# Patient Record
Sex: Female | Born: 1965 | Race: Black or African American | Hispanic: No | Marital: Married | Smoking: Former smoker
Health system: Southern US, Community
[De-identification: ages and names within clinical notes are randomized; demographics above are authoritative.]

## PROBLEM LIST (undated history)

## (undated) DIAGNOSIS — N920 Excessive and frequent menstruation with regular cycle: Secondary | ICD-10-CM

## (undated) HISTORY — DX: Excessive and frequent menstruation with regular cycle: N92.0

---

## 1998-04-18 ENCOUNTER — Other Ambulatory Visit: Admission: RE | Admit: 1998-04-18 | Discharge: 1998-04-18 | Payer: Self-pay | Admitting: Internal Medicine

## 2000-04-02 ENCOUNTER — Ambulatory Visit (HOSPITAL_COMMUNITY): Admission: RE | Admit: 2000-04-02 | Discharge: 2000-04-02 | Payer: Self-pay | Admitting: Obstetrics and Gynecology

## 2000-04-02 ENCOUNTER — Encounter: Payer: Self-pay | Admitting: Obstetrics and Gynecology

## 2000-04-17 ENCOUNTER — Other Ambulatory Visit: Admission: RE | Admit: 2000-04-17 | Discharge: 2000-04-17 | Payer: Self-pay | Admitting: Obstetrics and Gynecology

## 2000-06-07 ENCOUNTER — Ambulatory Visit (HOSPITAL_COMMUNITY): Admission: RE | Admit: 2000-06-07 | Discharge: 2000-06-07 | Payer: Self-pay | Admitting: Obstetrics and Gynecology

## 2000-06-07 ENCOUNTER — Encounter: Payer: Self-pay | Admitting: Obstetrics and Gynecology

## 2000-07-01 ENCOUNTER — Inpatient Hospital Stay (HOSPITAL_COMMUNITY): Admission: AD | Admit: 2000-07-01 | Discharge: 2000-07-01 | Payer: Self-pay | Admitting: Obstetrics and Gynecology

## 2000-11-11 ENCOUNTER — Inpatient Hospital Stay (HOSPITAL_COMMUNITY): Admission: AD | Admit: 2000-11-11 | Discharge: 2000-11-14 | Payer: Self-pay | Admitting: Obstetrics and Gynecology

## 2000-11-15 ENCOUNTER — Encounter: Admission: RE | Admit: 2000-11-15 | Discharge: 2000-12-15 | Payer: Self-pay | Admitting: Obstetrics and Gynecology

## 2001-02-21 ENCOUNTER — Other Ambulatory Visit: Admission: RE | Admit: 2001-02-21 | Discharge: 2001-02-21 | Payer: Self-pay | Admitting: Obstetrics and Gynecology

## 2001-05-22 ENCOUNTER — Ambulatory Visit (HOSPITAL_COMMUNITY): Admission: RE | Admit: 2001-05-22 | Discharge: 2001-05-22 | Payer: Self-pay | Admitting: Obstetrics and Gynecology

## 2001-05-22 ENCOUNTER — Encounter: Payer: Self-pay | Admitting: Obstetrics and Gynecology

## 2001-07-09 ENCOUNTER — Ambulatory Visit (HOSPITAL_COMMUNITY): Admission: RE | Admit: 2001-07-09 | Discharge: 2001-07-09 | Payer: Self-pay | Admitting: Obstetrics and Gynecology

## 2001-07-09 ENCOUNTER — Encounter: Payer: Self-pay | Admitting: Obstetrics and Gynecology

## 2001-08-07 ENCOUNTER — Encounter: Payer: Self-pay | Admitting: Obstetrics and Gynecology

## 2001-08-07 ENCOUNTER — Ambulatory Visit (HOSPITAL_COMMUNITY): Admission: RE | Admit: 2001-08-07 | Discharge: 2001-08-07 | Payer: Self-pay | Admitting: Obstetrics and Gynecology

## 2001-12-11 ENCOUNTER — Inpatient Hospital Stay (HOSPITAL_COMMUNITY): Admission: AD | Admit: 2001-12-11 | Discharge: 2001-12-14 | Payer: Self-pay | Admitting: Obstetrics and Gynecology

## 2002-01-21 ENCOUNTER — Other Ambulatory Visit: Admission: RE | Admit: 2002-01-21 | Discharge: 2002-01-21 | Payer: Self-pay | Admitting: Obstetrics and Gynecology

## 2003-07-05 ENCOUNTER — Other Ambulatory Visit: Admission: RE | Admit: 2003-07-05 | Discharge: 2003-07-05 | Payer: Self-pay | Admitting: Obstetrics and Gynecology

## 2004-10-16 ENCOUNTER — Other Ambulatory Visit: Admission: RE | Admit: 2004-10-16 | Discharge: 2004-10-16 | Payer: Self-pay | Admitting: Obstetrics and Gynecology

## 2005-10-30 ENCOUNTER — Other Ambulatory Visit: Admission: RE | Admit: 2005-10-30 | Discharge: 2005-10-30 | Payer: Self-pay | Admitting: Obstetrics and Gynecology

## 2009-04-12 ENCOUNTER — Ambulatory Visit (HOSPITAL_COMMUNITY): Admission: RE | Admit: 2009-04-12 | Discharge: 2009-04-12 | Payer: Self-pay | Admitting: Obstetrics and Gynecology

## 2010-08-04 NOTE — H&P (Signed)
NAME:  Kristine Johns, SIKKEMA NO.:  [id number]   MEDICAL RECORD NO.:  [id number]                   PATIENT TYPE:  INP   LOCATION:  9168                                 FACILITY:  [facility]   PHYSICIAN:  Hal Morales, M.D.             DATE OF BIRTH:  [birth year]   DATE OF ADMISSION:  12/11/2001  DATE OF DISCHARGE:                                HISTORY & PHYSICAL   HISTORY OF PRESENT ILLNESS:  The patient is a 45 year old gravida 3 para 2-0-  0-2 at 40 and six-sevenths weeks who presented from the office for a repeat  NST and PIH labs secondary to a borderline AFI of 7.2 and an elevated blood  pressure in the office.  She denied any headache, visual symptoms, or  epigastric pain.  In maternity admissions she was noted to have normal PIH  labs.  Blood pressures ranged in the 130s to 140s over 80s to 96 diastolic  range.  Her fetal heart rate tracing did show some sporadic intermittent  variables with no late decelerations and irregular mild contractions.  The  decision was made then to admit the patient for induction per Dr. Dierdre Forth.  Pregnancy has been remarkable for:  1. History of postpartum hypertension.  2. Daughter with occult spina bifida.  3. High risk HPV.  4. Closely-spaced pregnancies.   PRENATAL LABORATORY DATA:  Blood type AB positive, Rh antibody negative.  VDRL nonreactive.  Rubella titer positive.  Hepatitis B surface antigen  negative.  Colposcopy showed HPV changes and high-risk HPV.  Urinalysis in  April showed E. coli.  Glucose challenge was normal.  AFP was normal.  Sickle cell trait was negative.  Hemoglobin upon entering the practice was  12.4; it was 12.4 at 27 and six-sevenths weeks.  EDC of December 05, 2001  was established by 11 week ultrasound and was in agreement with ultrasound  at approximately 18 weeks.   HISTORY OF PRESENT PREGNANCY:  The patient entered care at approximately 15  weeks.  She had a urine culture at  her first visit that showed E. coli; this  was treated with Macrobid.  She had another ultrasound at 18 weeks.  She had  normal growth and fluid.  She was treated for a yeast infection at that  time.  She still had a positive urine culture at 18 weeks; this was treated  with ampicillin.  Test of cure was repeated that was sensitive to Septra.  She had a Glucola that was normal.  She was considering an IUD postpartum.  She had an ultrasound at 35 weeks that showed vertex presentation.  She had  some bright red bleeding at 36 weeks.  She was seen at that time.  She still  had a positive  Chem-9 and was treated with Macrobid.  The rest of her pregnancy was  essentially uncomplicated.  She had a visit today  and had elevated blood  pressures.  She had an NST due to being almost 41 weeks that was  questionably reactive.  She had a biophysical profile that was within normal  limits and an AFI of 7.2.   OBSTETRICAL HISTORY:  In 1988 she had the vaginal birth of a female infant  that weighed 6 pounds 14 ounces at [redacted] weeks gestation.  She was in labor 12  hours.  She had unsure type of medication.  In August 2002 she had a vaginal  birth of a female infant, weight 7 pounds 13 ounces at 40 and six-sevenths  weeks.  She was in labor 10 hours.  She had epidural anesthesia.   MEDICAL HISTORY:  She is a previous diaphragm and condom user.  She has a  history of high-risk HPV changes on Pap.  She was treated for [facility] and  chlamydia in 2001.  She has occasional yeast infections.  She has a history  of acid reflux disease.  She broke her right arm in the second grade.  Her  only other hospitalization was for childbirth.   ALLERGIES:  The patient has no known medication allergies.   FAMILY HISTORY:  Her first cousin had a stroke at age 6.  Her mother is  hypertensive, on medication.  Maternal uncle had lung cancer.   GENETIC HISTORY:  Remarkable for the patient's daughter having questionable  occult spina  bifida.  There were no deficits and this was seen on x-ray.  There were no pilonidal cysts noted or skin openings.   SOCIAL HISTORY:  The patient is married to the father of the baby.  He is  involved and supportive.  His name is Chia Mowers.  The patient is graduate  educated; she is a Runner, broadcasting/film/video.  Her husband has college education; he is city  Financial controller.  She has been followed by the physician service of [facility].  She denies any alcohol, drug, or tobacco use during this pregnancy.  She is a previous smoker but stopped approximately one year ago.   PHYSICAL EXAMINATION:  VITAL SIGNS:  Blood pressure 130/80.  Other vital  signs are stable.  HEENT:  Within normal limits.  LUNGS:  Bilateral breath sounds are clear.  HEART:  Regular rate and rhythm without murmur.  BREASTS:  Soft and nontender.  ABDOMEN:  Fundal height is approximately 39 cm.  Estimated fetal weight is  7.5 to 8.5 pounds.  Uterine contractions are very occasional and mild.  PELVIC:  Cervical exam by Dr. Pennie Rushing was 2-3 cm, 70%, vertex at a -2  station.  Fetal heart rate shows intermittent variables but no late  decelerations.  There was more variability as the tracing progressed.  LABORATORY DATA:  PIH labs were within normal limits.  Urine was negative at  the office for protein.  EXTREMITIES:  Deep tendon reflexes are 2+ without clonus.  There is a trace  edema noted.   IMPRESSION:  1. Intrauterine pregnancy at 40 and six-sevenths weeks.  2. Favorable cervix.  3. Equivocal fetal heart rate tracing.  4. Post dates.   PLAN:  1. Admit to birthing suite per consult with Dr. Dierdre Forth as attending     physician.  2. Routine physician orders.  3. Dr. Pennie Rushing plans Pitocin per low-dose protocol.     Renaldo Reel Emilee Hero, C.N.M.                   Hal Morales, M.D.  VLL/MEDQ  D:  12/11/2001  T:  12/11/2001  Job:  [id number]

## 2010-08-04 NOTE — H&P (Signed)
[facility]  Patient:    Kristine Johns, Kristine Johns Visit Number: [id number] MRN: [id number]          Service Type: OBS Location: 910A 9109 01 Attending Physician:  Leonard Schwartz Dictated by:   Nigel Bridgeman, C.N.M. Adm. Date:  11/11/2000                           History and Physical  HISTORY OF PRESENT ILLNESS:   The patient is a 45 year old gravida 2, para 1-0-0-1, at 40-6/7 weeks, who presents for induction secondary to post dates, a nonreactive NST today in the office, and a biophysical profile of 4 out of 8. Cervix is 1 cm, 50% vertex, and a -2 in the office today.  Pregnancy has been remarkable for: 1. Positive group B strep. 2. First trimester spotting. 3. Smoker. 4. History of STDs. 5. Daughter with questionable spina bifida occulta. 6. Ascus on Pap and new OB, with a plan to repeat postpartum.  PRENATAL LABORATORY DATA:     Blood type is AB-positive.  Rh antibody negative.  VDRL nonreactive.  Rubella titer positive.  Hepatitis B surface antigen negative.  HIV nonreactive.  Sickle cell test is negative.  Pap on new OB showed ascus.  GC and Chlamydia cultures were negative.  AFP was normal. One-hour glucola was normal.  Hemoglobin upon entry into practice was 12.1. It was 10.7 at 27 weeks.  The patient declined repeat Pap smear during her pregnancy and wished this to be done postpartum.  EDC of November 05, 2000, was established by last menstrual period and was in agreement with ultrasound at approximately eight weeks and then again at 19 weeks.  Group B strep culture was positive at 36 weeks.  HISTORY OF PRESENT PREGNANCY: The patient entered care at approximately 11 weeks.  She had had some first trimester bleeding for which she had an ultrasound.  She had an ultrasound at [facility] at 18 weeks with normal findings.  She had some cramping at 20 weeks.  Ascus on Pap was reviewed at 24 weeks.  The patient was upset that she was not  informed.  She declined a repeat until postpartum.  She had a UTI noted and was treated with Macrobid. She was treated again with Macrobid at 33 weeks.  Beta strep was positive. She had an ultrasound at 40 weeks to verify position.  She had an NST today that was nonreactive and a BPP which was 4 out of 8 with no fetal breathing movement and poor tone.  OBSTETRICAL HISTORY:          In 1998, she had a vaginal birth of a female infant, weight 6 pounds and 14 ounces at [redacted] weeks gestation.  She was in labor 12 hours.  She had no problems.  PAST SURGICAL HISTORY:        She is a diaphragm and condom user.  She had an abnormal Pap in April 2000.  Her repeat q.6 months were normal.  In August 2001, she was treated for gonorrhea and Chlamydia.  Her partner was also treated.  She has occasional yeast infection three to four times per year. She reports usual childhood illnesses.  She had a history of acid reflux during stress at times.  She has a history of a social smoker.  She broke her right arm in 7th grade.  Her only other hospitalizations were for childbirth.  ALLERGIES:  No known drug allergies.  FAMILY HISTORY:               Her mother is hypertensive on medication.  Her maternal uncle had lung cancer and was a smoker.  Her first cousin on her mothers side had stroke in early 92s.  Father of the baby is a smoker.  GENETIC HISTORY:              Remarkable for recently finding out that her daughters spine was not fully closed that may be a spina bifida occulta.  SOCIAL HISTORY:               The patient is married.  The father of the baby is involved and supportive.  His name is Yarixa Lightcap.  The patient is college educated.  She is a Runner, broadcasting/film/video.  Her husband has a high school education and he is employed as a Careers adviser.  She is African-American of the [employer]. She has been followed by certified nurse midwife service at [facility].  She denies any alcohol  or drug use during this pregnancy.  She has had some social tobacco use.  PHYSICAL EXAMINATION:  VITAL SIGNS:                  Stable and the patient is afebrile.  HEENT:                        Within normal limits.  LUNGS:                        Bilateral breath sounds are clear.  HEART:                        Regular rate and rhythm without murmur.  BREASTS:                      Soft and nontender.  ABDOMEN:                      Fundal height is approximately 39 cm.  Estimated fetal weight is 7-1/2 to 8 pounds.  Uterine contractions are very occasional and mild.  CERVICAL EXAMINATION:         Today in the office was 1 cm, 50% vertex, and a -2 station.  Fetal heart rate is in process of being evaluated.  The patient is now being placed on the monitor.  EXTREMITIES:                  Deep tendon reflexes are 2+ without clonus. There is a trace edema noted.  IMPRESSION:                   1. Intrauterine pregnancy at 40-6/7 weeks.                               2. For induction secondary to post dates,                                  nonreactive nonstress test, and biophysical  profile of 4 out of 8.                               3. Positive group B streptococcus.  PLAN:                         1. Admit to birthing suite for consult with                                  Dr. Marline Backbone, the attending physician.                               2. Routine certified nurse midwife orders.                               3. Plan group B strep prophylaxis with                                  penicillin G for standard dosing once labor                                  ensues.                               4. Plan Cytotec this evening and Pitocin                                  initiation in the morning. Dictated by:   Nigel Bridgeman, C.N.M. Attending Physician:  Leonard Schwartz DD:    /  / TD:  11/11/00 Job: [id number]

## 2010-08-04 NOTE — H&P (Signed)
[facility] of [facility]  Patient:    Kristine Johns, Kristine Johns Visit Number: [id number] MRN: [id number]          Service Type: OBS Location: 910B 9163 01 Attending Physician:  Leonard Schwartz Dictated by:   Nigel Bridgeman, C.N.M. Adm. Date:  11/11/2000                           History and Physical  HISTORY OF PRESENT ILLNESS:   The patient is a 45 year old gravida 2, para 1-0-0-1, at 40-6/7 weeks who presents for induction secondary to post dates, a nonreactive NST today in the office, and a biophysical profile of 4 out of 8. Cervix is 1 cm, 50% vertex, and a -2 in the office today.  Pregnancy has been remarkable for:  1. Positive group B strep. 2. First trimester spotting. 3. Smoker. 4. History of STDs. 5. History with questionable spina bifida culture. 6. ASCUS on Pap, _______ with a plan to repeat postpartum.  PRENATAL LABORATORY DATA:     Blood type is AB-positive.  Rh antibody negative.  VDRL nonreactive.  Rubella titer positive.  Hepatitis B surface antigen negative.  HIV nonreactive.  Sickle cell test is negative.  Pap on _______ showed ASCUS.  GC and Chlamydia cultures were negative.  AFP was normal.  One-hour glucola was normal.  Hemoglobin upon entry into practice was 12.1.  It was 10.7 at 27 weeks.  The patient declined repeat Pap smear during her pregnancy and wished this to be done postpartum.  EDC of November 05, 2000, was established by last menstrual period and was in agreement with ultrasound at approximately eight weeks and then again at 19 weeks.  Group B strep culture was positive at 36 weeks.  HISTORY OF PRESENT PREGNANCY: The patient entered care at approximately 11 weeks.  She had had some first trimester bleeding for which she had an ultrasound.  She had an ultrasound at [facility] at 18 weeks with normal findings.  She had some cramping at 20 weeks.  ASCUS on Pap was reviewed at 24 weeks.  The patient was upset that she was not  informed.  She declined a repeat until postpartum.  She had a UTI noted and was treated with Macrobid. She was treated again with Macrobid at 33 weeks.  Beta strep was positive. She had an ultrasound at 40 weeks to verify position.  She had an NST today that was nonreactive and a ____ which was 4 out of 8 with no fetal breathing movement and poor tone.  OBSTETRICAL HISTORY:          In 1998, she had a vaginal birth of a female infant, weight 6 pounds and 14 ounces at [redacted] weeks gestation.  She was in labor 12 hours.  She had no problems.  PAST SURGICAL HISTORY:        She is a diaphragm and condom user.  She had an abnormal Pap in April 2000.  Her repeat q.6 months were normal.  In August 2001, she was treated for gonorrhea and Chlamydia.  Her partner was also treated.  She has occasional yeast infection three to four times per year. She reports usual childhood illnesses.  She had a history of acid reflux during stress at times.  She has a history of a social smoker.  She broke her right arm in 7th grade.  Her only other hospitalizations were for childbirth.  ALLERGIES:  No known drug allergies.  FAMILY HISTORY:               Her mother is hypertensive on medication.  Her maternal uncle had lung cancer and was a smoker.  Her first cousin on her mothers side had stroke in early 34s.  Father of the baby is a smoker.  GENETIC HISTORY:              Remarkable for recently finding out that her daughters spine was not fully closed that may be a spina bifida occulta.  SOCIAL HISTORY:               The patient is married.  The father of the baby is involved and supportive.  His name is Serinity Ware.  The patient is college educated.  She is a Runner, broadcasting/film/video.  Her husband has a high school education and he is employed as a Careers adviser.  She is African-American of the [employer]. She has been followed by certified nurse midwife service at [facility].  She denies any  alcohol or drug use during this pregnancy.  She has had some social tobacco use.  PHYSICAL EXAMINATION:  VITAL SIGNS:                  Stable and the patient is afebrile.  HEENT:                        Within normal limits.  LUNGS:                        Bilateral breath sounds are clear.  HEART:                        Regular rate and rhythm without murmur.  BREASTS:                      Soft and nontender.  ABDOMEN:                      Fundal height is approximately 39 cm.  Estimated fetal weight is 7-1/2 to 8 pounds.  Uterine contractions are very occasional and mild.  CERVICAL EXAMINATION:         Today in the office was 1 cm, 50% vertex, and a -2 station.  Fetal heart rate is in process of being evaluated.  The patient is now being placed on the monitor.  EXTREMITIES:                  Deep tendon reflexes are 2+ without clonus. There is a trace edema noted.  IMPRESSION:                   1. Intrauterine pregnancy at 40-6/7 weeks.                               2. For induction secondary to post dates,                                  nonreactive nonstress test, and ____ of  4 out of 8.                               3. Positive group B streptococcus.  PLAN:                         1. Admit to birthing suite for consult with                                  Dr. Marline Backbone, the attending physician.                               2. Routine certified nurse midwife orders.                               3. Plan group B strep prophylaxis with                                  penicillin G for standard dosing once labor                                  ensues.                               4. Plan Cytotec this evening and Pitocin                                  initiation in the morning. Dictated by:   Nigel Bridgeman, C.N.M. Attending Physician:  Leonard Schwartz DD:  11/11/00 TD:  11/11/00 Job: [id number]

## 2010-09-26 ENCOUNTER — Other Ambulatory Visit: Payer: Self-pay | Admitting: Family Medicine

## 2010-09-26 DIAGNOSIS — Z1231 Encounter for screening mammogram for malignant neoplasm of breast: Secondary | ICD-10-CM

## 2011-02-15 ENCOUNTER — Other Ambulatory Visit (HOSPITAL_COMMUNITY): Payer: Self-pay | Admitting: Family Medicine

## 2011-02-15 DIAGNOSIS — Z1231 Encounter for screening mammogram for malignant neoplasm of breast: Secondary | ICD-10-CM

## 2011-03-23 ENCOUNTER — Ambulatory Visit (HOSPITAL_COMMUNITY)
Admission: RE | Admit: 2011-03-23 | Discharge: 2011-03-23 | Disposition: A | Discharge status: Home / Self Care | Payer: BC Managed Care – PPO | Source: Ambulatory Visit | Attending: Family Medicine | Admitting: Family Medicine

## 2011-03-23 DIAGNOSIS — Z1231 Encounter for screening mammogram for malignant neoplasm of breast: Secondary | ICD-10-CM | POA: Insufficient documentation

## 2011-03-23 IMAGING — MG MM DIGITAL SCREENING BILAT
5 series · 5 of 5 positions shown · non-contrast
Comparison: Prior studies.

DG SCREEN MAMMOGRAM BILATERAL
Bilateral CC and MLO view(s) were taken.
Prior study comparison: [DATE], DG screen mammogram bilateral.

DIGITAL SCREENING MAMMOGRAM WITH CAD:

[R CC (1 of 2)]
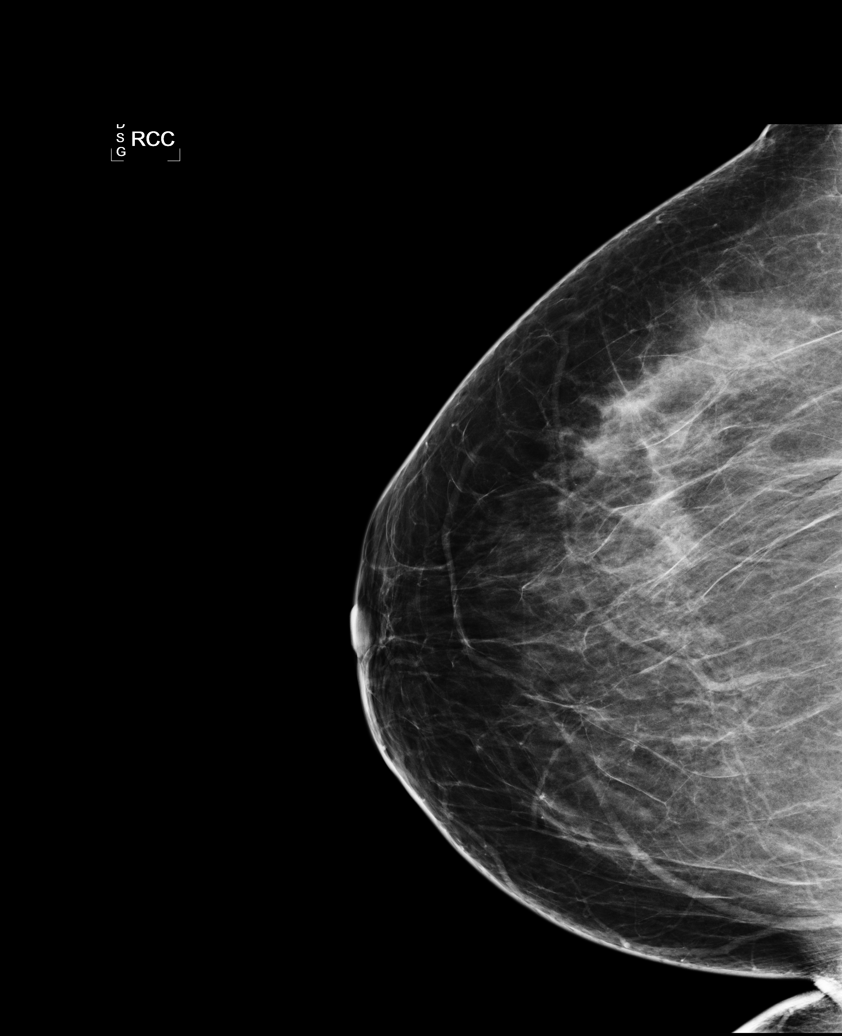

[R MLO]
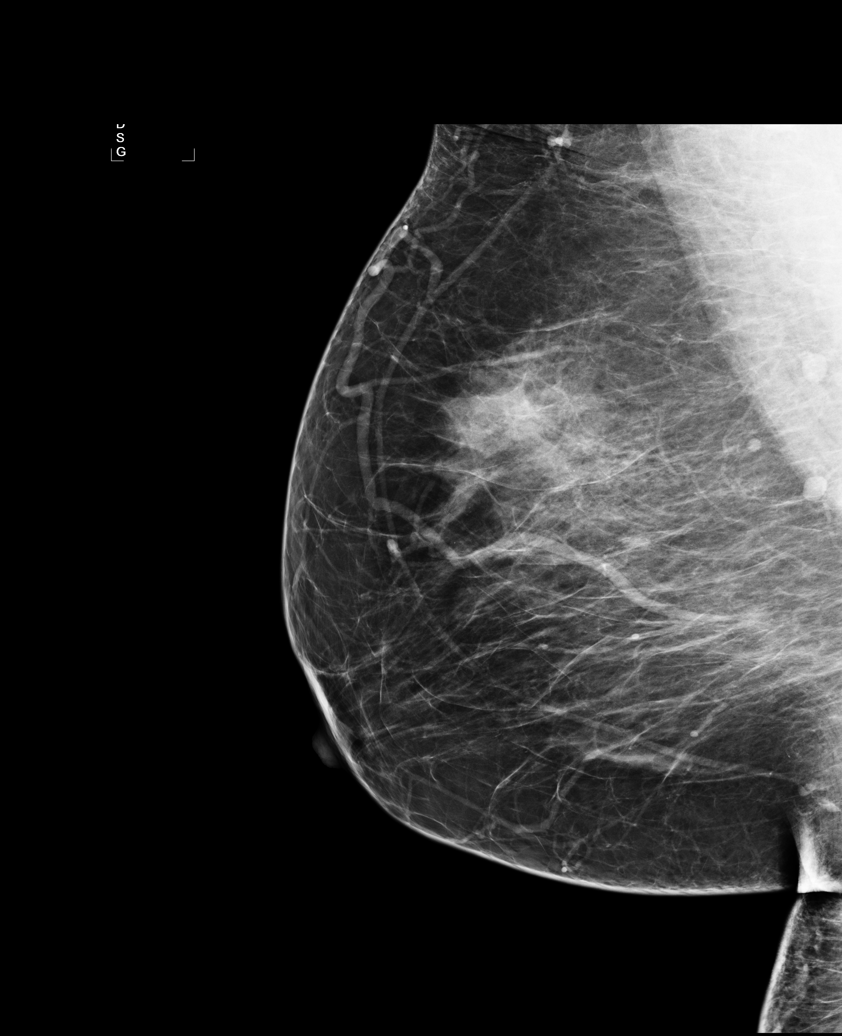

[L CC]
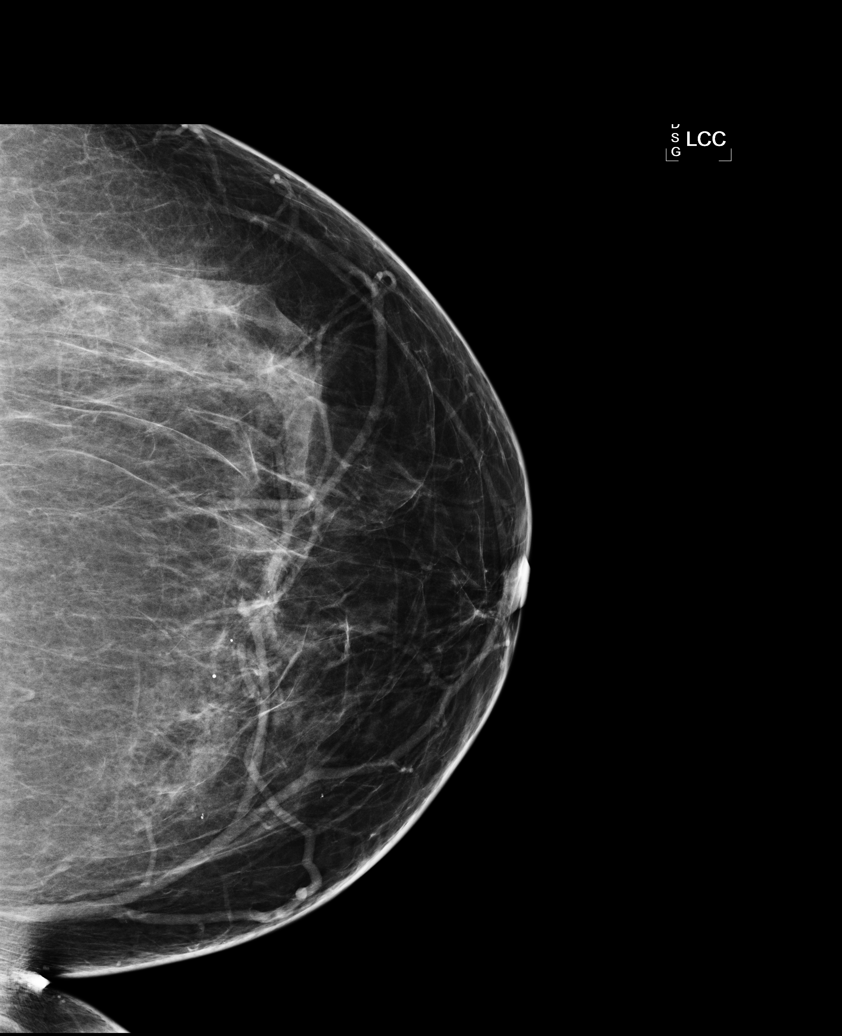

[L MLO]
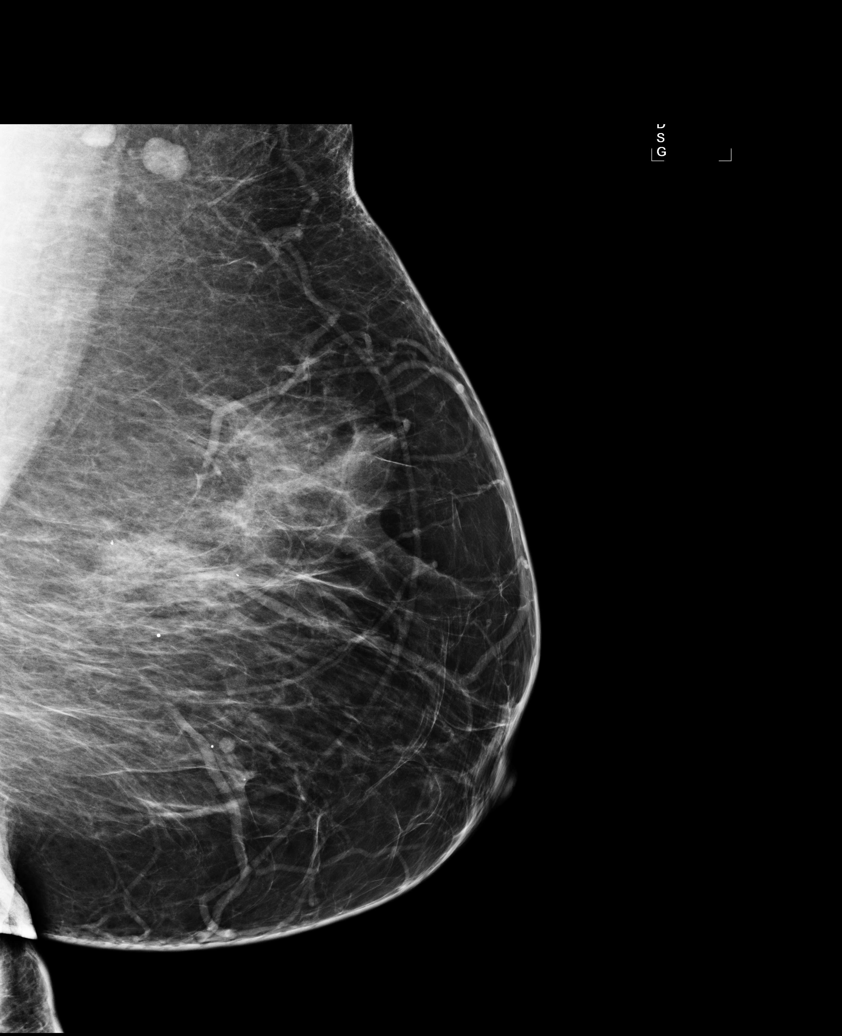

[R CC (2 of 2)]
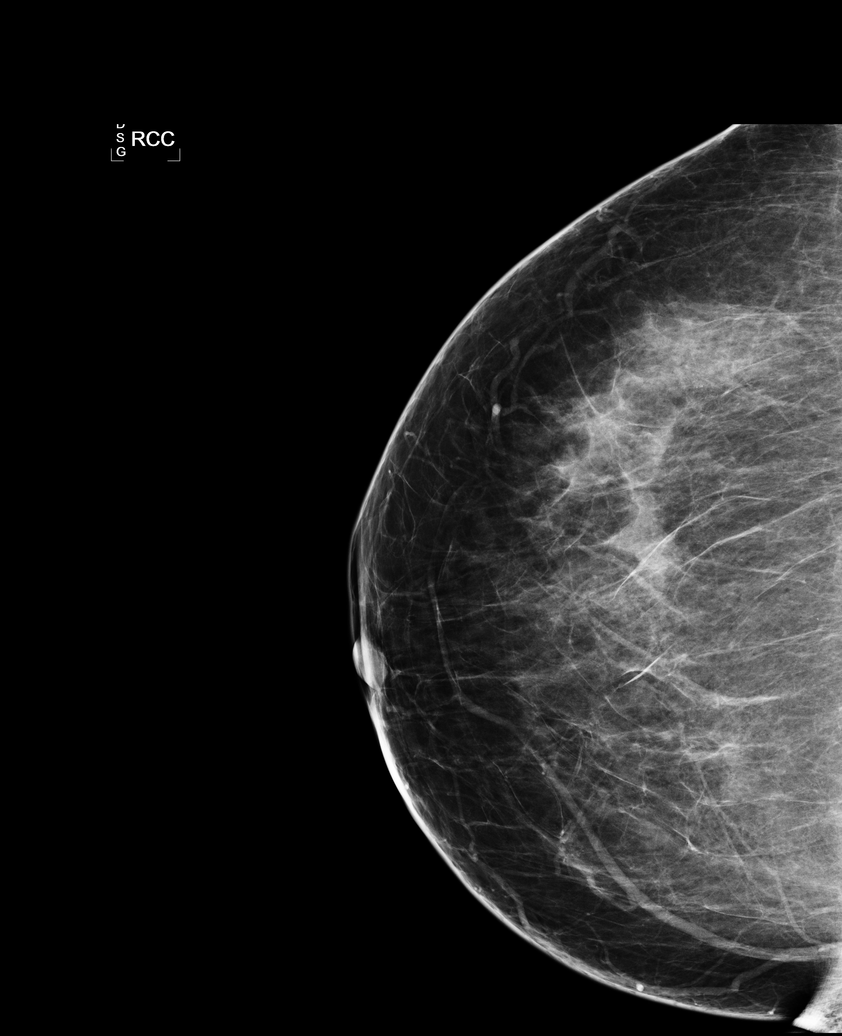

[5 of 5 positions shown; findings below may reference images not displayed]

There are scattered fibroglandular densities.  There is no dominant mass, architectural distortion 
or calcification to suggest malignancy.

Images were processed with CAD.
IMPRESSION: No mammographic evidence of malignancy.  Suggest yearly screening mammography.

A result letter of this screening mammogram will be mailed directly to the patient.

ASSESSMENT: Negative - BI-RADS 1

Screening mammogram in 1 year.
,

## 2012-04-14 ENCOUNTER — Encounter: Payer: Self-pay | Admitting: Obstetrics and Gynecology

## 2012-04-14 ENCOUNTER — Ambulatory Visit: Payer: 59 | Admitting: Obstetrics and Gynecology

## 2012-04-14 VITALS — BP 112/80 | Ht 68.0 in | Wt 227.0 lb

## 2012-04-14 DIAGNOSIS — Z01419 Encounter for gynecological examination (general) (routine) without abnormal findings: Secondary | ICD-10-CM

## 2012-04-14 DIAGNOSIS — N920 Excessive and frequent menstruation with regular cycle: Secondary | ICD-10-CM | POA: Insufficient documentation

## 2012-04-14 DIAGNOSIS — Z124 Encounter for screening for malignant neoplasm of cervix: Secondary | ICD-10-CM

## 2012-04-14 NOTE — Progress Notes (Addendum)
Subjective:    Kristine Johns is a 47 y.o. female, G3P3, who presents for an annual exam. The patient reports no complaints.  Menstrual cycle:   LMP: No LMP recorded. Patient is not currently having periods (Reason: IUD).             Review of Systems Pertinent items are noted in HPI. Denies pelvic pain, urinary tract symptoms, vaginitis symptoms, irregular bleeding, menopausal symptoms, change in bowel habits or rectal bleeding   Objective:    BP 112/80  Ht 5\' 8"  (1.727 m)  Wt 227 lb (102.967 kg)  BMI 34.52 kg/m2   Wt Readings from Last 1 Encounters:  04/14/12 227 lb (102.967 kg)   Body mass index is 34.52 kg/(m^2). General Appearance: Alert, no acute distress HEENT: Grossly normal Neck / Thyroid: Supple, no thyromegaly or cervical adenopathy Lungs: Clear to auscultation bilaterally Back: No CVA tenderness Breast Exam: No masses or nodes.No dimpling, nipple retraction or discharge. Cardiovascular: Regular rate and rhythm.  Gastrointestinal: Soft, non-tender, no masses or organomegaly Pelvic Exam: EGBUS-wnl, vagina-normal rugae, cervix- strings visible, posterior, without lesions or tenderness, uterus appears normal size shape and consistency, adnexae-no masses or tenderness Rectovaginal: no masses and normal sphincter tone Lymphatic Exam: Non-palpable nodes in neck, clavicular,  axillary, or inguinal regions  Skin: no rashes or abnormalities Extremities: no clubbing cyanosis or edema  Neurologic: grossly normal Psychiatric: Alert and oriented    Assessment:   Routine GYN Exam Mirena Expires November 2014   Plan:   Schedule Mirena removal and reinsertion before November 2014  PAP  sent  RTO 1 year or prn  Bethel Gaglio,ELMIRAPA-C

## 2012-04-14 NOTE — Progress Notes (Signed)
Regular Periods; No   Mammogram: yes  Monthly Breast Ex.: yes Exercise: yes  Tetanus < 10 years: no Seatbelts: yes  NI. Bladder Functn.: yes Abuse at home: no  Daily BM's: yes Stressful Work: yes  Healthy Diet: yes Sigmoid-Colonoscopy: n/a  Calcium: no Medical problems this year: none   LAST PAP:04/04/2011 Normal  Contraception: Mirena  Mammogram:  03/26/2011 Normal  PCP: Dr Cain Saupe  PMH: none  FMH: none  Last Bone Scan: n/a

## 2012-04-16 ENCOUNTER — Encounter: Payer: Self-pay | Admitting: Obstetrics and Gynecology

## 2012-04-16 LAB — PAP IG W/ RFLX HPV ASCU

## 2012-08-14 ENCOUNTER — Other Ambulatory Visit (HOSPITAL_COMMUNITY): Payer: Self-pay | Admitting: Family Medicine

## 2012-08-14 DIAGNOSIS — Z1231 Encounter for screening mammogram for malignant neoplasm of breast: Secondary | ICD-10-CM

## 2012-08-26 ENCOUNTER — Ambulatory Visit (HOSPITAL_COMMUNITY)
Admission: RE | Admit: 2012-08-26 | Discharge: 2012-08-26 | Disposition: A | Discharge status: Home / Self Care | Payer: 59 | Source: Ambulatory Visit | Attending: Family Medicine | Admitting: Family Medicine

## 2012-08-26 DIAGNOSIS — Z1231 Encounter for screening mammogram for malignant neoplasm of breast: Secondary | ICD-10-CM | POA: Insufficient documentation

## 2012-10-07 ENCOUNTER — Ambulatory Visit
Admission: RE | Admit: 2012-10-07 | Discharge: 2012-10-07 | Disposition: A | Discharge status: Home / Self Care | Payer: BC Managed Care – PPO | Source: Ambulatory Visit | Attending: Family Medicine | Admitting: Family Medicine

## 2012-10-07 ENCOUNTER — Other Ambulatory Visit: Payer: Self-pay | Admitting: Family Medicine

## 2012-10-07 DIAGNOSIS — R52 Pain, unspecified: Secondary | ICD-10-CM

## 2013-10-02 ENCOUNTER — Other Ambulatory Visit: Payer: Self-pay | Admitting: Obstetrics and Gynecology

## 2013-10-02 DIAGNOSIS — Z1231 Encounter for screening mammogram for malignant neoplasm of breast: Secondary | ICD-10-CM

## 2013-10-09 ENCOUNTER — Ambulatory Visit (HOSPITAL_COMMUNITY)
Admission: RE | Admit: 2013-10-09 | Discharge: 2013-10-09 | Disposition: A | Discharge status: Home / Self Care | Payer: BC Managed Care – PPO | Source: Ambulatory Visit | Attending: Obstetrics and Gynecology | Admitting: Obstetrics and Gynecology

## 2013-10-09 DIAGNOSIS — Z1231 Encounter for screening mammogram for malignant neoplasm of breast: Secondary | ICD-10-CM | POA: Insufficient documentation

## 2013-10-12 ENCOUNTER — Other Ambulatory Visit: Payer: Self-pay | Admitting: Obstetrics and Gynecology

## 2013-10-12 DIAGNOSIS — R928 Other abnormal and inconclusive findings on diagnostic imaging of breast: Secondary | ICD-10-CM

## 2013-10-16 ENCOUNTER — Ambulatory Visit
Admission: RE | Admit: 2013-10-16 | Discharge: 2013-10-16 | Disposition: A | Discharge status: Home / Self Care | Payer: BC Managed Care – PPO | Source: Ambulatory Visit | Attending: Obstetrics and Gynecology | Admitting: Obstetrics and Gynecology

## 2013-10-16 DIAGNOSIS — R928 Other abnormal and inconclusive findings on diagnostic imaging of breast: Secondary | ICD-10-CM

## 2014-01-18 ENCOUNTER — Encounter: Payer: Self-pay | Admitting: Obstetrics and Gynecology

## 2014-11-11 ENCOUNTER — Other Ambulatory Visit: Payer: Self-pay | Admitting: Obstetrics and Gynecology

## 2014-11-11 DIAGNOSIS — N63 Unspecified lump in unspecified breast: Secondary | ICD-10-CM

## 2014-11-16 ENCOUNTER — Other Ambulatory Visit: Payer: Self-pay

## 2014-12-07 ENCOUNTER — Ambulatory Visit (INDEPENDENT_AMBULATORY_CARE_PROVIDER_SITE_OTHER): Payer: Commercial Managed Care - HMO | Admitting: Family Medicine

## 2014-12-07 VITALS — BP 126/90 | HR 66 | Temp 98.6°F | Resp 16 | Ht 68.0 in | Wt 256.8 lb

## 2014-12-07 DIAGNOSIS — R030 Elevated blood-pressure reading, without diagnosis of hypertension: Secondary | ICD-10-CM

## 2014-12-07 DIAGNOSIS — IMO0001 Reserved for inherently not codable concepts without codable children: Secondary | ICD-10-CM

## 2014-12-07 LAB — COMPREHENSIVE METABOLIC PANEL
ALT: 17 U/L (ref 6–29)
AST: 16 U/L (ref 10–35)
Albumin: 4.3 g/dL (ref 3.6–5.1)
Alkaline Phosphatase: 51 U/L (ref 33–115)
BILIRUBIN TOTAL: 0.7 mg/dL (ref 0.2–1.2)
BUN: 10 mg/dL (ref 7–25)
CO2: 29 mmol/L (ref 20–31)
Calcium: 9.3 mg/dL (ref 8.6–10.2)
Chloride: 107 mmol/L (ref 98–110)
Creat: 0.79 mg/dL (ref 0.50–1.10)
GLUCOSE: 75 mg/dL (ref 65–99)
Potassium: 4.6 mmol/L (ref 3.5–5.3)
Sodium: 143 mmol/L (ref 135–146)
Total Protein: 7.5 g/dL (ref 6.1–8.1)

## 2014-12-07 LAB — POCT GLYCOSYLATED HEMOGLOBIN (HGB A1C): Hemoglobin A1C: 5.7

## 2014-12-07 MED ORDER — HYDROCHLOROTHIAZIDE 12.5 MG PO TABS: 12.5000 mg | ORAL_TABLET | Freq: Every day | ORAL | Status: DC

## 2014-12-07 NOTE — Patient Instructions (Signed)
DASH Eating Plan DASH stands for "Dietary Approaches to Stop Hypertension." The DASH eating plan is a healthy eating plan that has been shown to reduce high blood pressure (hypertension). Additional health benefits may include reducing the risk of type 2 diabetes mellitus, heart disease, and stroke. The DASH eating plan may also help with weight loss. WHAT DO I NEED TO KNOW ABOUT THE DASH EATING PLAN? For the DASH eating plan, you will follow these general guidelines:  Choose foods with a percent daily value for sodium of less than 5% (as listed on the food label).  Use salt-free seasonings or herbs instead of table salt or sea salt.  Check with your health care provider or pharmacist before using salt substitutes.  Eat lower-sodium products, often labeled as "lower sodium" or "no salt added."  Eat fresh foods.  Eat more vegetables, fruits, and low-fat dairy products.  Choose whole grains. Look for the word "whole" as the first word in the ingredient list.  Choose fish and skinless chicken or turkey more often than red meat. Limit fish, poultry, and meat to 6 oz (170 g) each day.  Limit sweets, desserts, sugars, and sugary drinks.  Choose heart-healthy fats.  Limit cheese to 1 oz (28 g) per day.  Eat more home-cooked food and less restaurant, buffet, and fast food.  Limit fried foods.  Cook foods using methods other than frying.  Limit canned vegetables. If you do use them, rinse them well to decrease the sodium.  When eating at a restaurant, ask that your food be prepared with less salt, or no salt if possible. WHAT FOODS CAN I EAT? Seek help from a dietitian for individual calorie needs. Grains Whole grain or whole wheat bread. Brown rice. Whole grain or whole wheat pasta. Quinoa, bulgur, and whole grain cereals. Low-sodium cereals. Corn or whole wheat flour tortillas. Whole grain cornbread. Whole grain crackers. Low-sodium crackers. Vegetables Fresh or frozen vegetables  (raw, steamed, roasted, or grilled). Low-sodium or reduced-sodium tomato and vegetable juices. Low-sodium or reduced-sodium tomato sauce and paste. Low-sodium or reduced-sodium canned vegetables.  Fruits All fresh, canned (in natural juice), or frozen fruits. Meat and Other Protein Products Ground beef (85% or leaner), grass-fed beef, or beef trimmed of fat. Skinless chicken or turkey. Ground chicken or turkey. Pork trimmed of fat. All fish and seafood. Eggs. Dried beans, peas, or lentils. Unsalted nuts and seeds. Unsalted canned beans. Dairy Low-fat dairy products, such as skim or 1% milk, 2% or reduced-fat cheeses, low-fat ricotta or cottage cheese, or plain low-fat yogurt. Low-sodium or reduced-sodium cheeses. Fats and Oils Tub margarines without trans fats. Light or reduced-fat mayonnaise and salad dressings (reduced sodium). Avocado. Safflower, olive, or canola oils. Natural peanut or almond butter. Other Unsalted popcorn and pretzels. The items listed above may not be a complete list of recommended foods or beverages. Contact your dietitian for more options. WHAT FOODS ARE NOT RECOMMENDED? Grains White bread. White pasta. White rice. Refined cornbread. Bagels and croissants. Crackers that contain trans fat. Vegetables Creamed or fried vegetables. Vegetables in a cheese sauce. Regular canned vegetables. Regular canned tomato sauce and paste. Regular tomato and vegetable juices. Fruits Dried fruits. Canned fruit in light or heavy syrup. Fruit juice. Meat and Other Protein Products Fatty cuts of meat. Ribs, chicken wings, bacon, sausage, bologna, salami, chitterlings, fatback, hot dogs, bratwurst, and packaged luncheon meats. Salted nuts and seeds. Canned beans with salt. Dairy Whole or 2% milk, cream, half-and-half, and cream cheese. Whole-fat or sweetened yogurt. Full-fat   cheeses or blue cheese. Nondairy creamers and whipped toppings. Processed cheese, cheese spreads, or cheese  curds. Condiments Onion and garlic salt, seasoned salt, table salt, and sea salt. Canned and packaged gravies. Worcestershire sauce. Tartar sauce. Barbecue sauce. Teriyaki sauce. Soy sauce, including reduced sodium. Steak sauce. Fish sauce. Oyster sauce. Cocktail sauce. Horseradish. Ketchup and mustard. Meat flavorings and tenderizers. Bouillon cubes. Hot sauce. Tabasco sauce. Marinades. Taco seasonings. Relishes. Fats and Oils Butter, stick margarine, lard, shortening, ghee, and bacon fat. Coconut, palm kernel, or palm oils. Regular salad dressings. Other Pickles and olives. Salted popcorn and pretzels. The items listed above may not be a complete list of foods and beverages to avoid. Contact your dietitian for more information. WHERE CAN I FIND MORE INFORMATION? National Heart, Lung, and Blood Institute: travelstabloid.com Document Released: 02/22/2011 Document Revised: 07/20/2013 Document Reviewed: 01/07/2013 [facility] Patient Information 2015 [place], [place]. This information is not intended to replace advice given to you by your health care provider. Make sure you discuss any questions you have with your health care provider. Managing Your High Blood Pressure Blood pressure is a measurement of how forceful your blood is pressing against the walls of the arteries. Arteries are muscular tubes within the circulatory system. Blood pressure does not stay the same. Blood pressure rises when you are active, excited, or nervous; and it lowers during sleep and relaxation. If the numbers measuring your blood pressure stay above normal most of the time, you are at risk for health problems. High blood pressure (hypertension) is a long-term (chronic) condition in which blood pressure is elevated. A blood pressure reading is recorded as two numbers, such as 120 over 80 (or 120/80). The first, higher number is called the systolic pressure. It is a measure of the pressure in your  arteries as the heart beats. The second, lower number is called the diastolic pressure. It is a measure of the pressure in your arteries as the heart relaxes between beats.  Keeping your blood pressure in a normal range is important to your overall health and prevention of health problems, such as heart disease and stroke. When your blood pressure is uncontrolled, your heart has to work harder than normal. High blood pressure is a very common condition in adults because blood pressure tends to rise with age. Men and women are equally likely to have hypertension but at different times in life. Before age 5, men are more likely to have hypertension. After 47 years of age, women are more likely to have it. Hypertension is especially common in African Americans. This condition often has no signs or symptoms. The cause of the condition is usually not known. Your caregiver can help you come up with a plan to keep your blood pressure in a normal, healthy range. BLOOD PRESSURE STAGES Blood pressure is classified into four stages: normal, prehypertension, stage 1, and stage 2. Your blood pressure reading will be used to determine what type of treatment, if any, is necessary. Appropriate treatment options are tied to these four stages:  Normal  Systolic pressure (mm Hg): below 120.  Diastolic pressure (mm Hg): below 80. Prehypertension  Systolic pressure (mm Hg): 120 to 139.  Diastolic pressure (mm Hg): 80 to 89. Stage1  Systolic pressure (mm Hg): 140 to 159.  Diastolic pressure (mm Hg): 90 to 99. Stage2  Systolic pressure (mm Hg): 160 or above.  Diastolic pressure (mm Hg): 100 or above. RISKS RELATED TO HIGH BLOOD PRESSURE Managing your blood pressure is an important responsibility. Uncontrolled  high blood pressure can lead to:  A heart attack.  A stroke.  A weakened blood vessel (aneurysm).  Heart failure.  Kidney damage.  Eye damage.  Metabolic syndrome.  Memory and concentration  problems. HOW TO MANAGE YOUR BLOOD PRESSURE Blood pressure can be managed effectively with lifestyle changes and medicines (if needed). Your caregiver will help you come up with a plan to bring your blood pressure within a normal range. Your plan should include the following: Education  Read all information provided by your caregivers about how to control blood pressure.  Educate yourself on the latest guidelines and treatment recommendations. New research is always being done to further define the risks and treatments for high blood pressure. Lifestylechanges  Control your weight.  Avoid smoking.  Stay physically active.  Reduce the amount of salt in your diet.  Reduce stress.  Control any chronic conditions, such as high cholesterol or diabetes.  Reduce your alcohol intake. Medicines  Several medicines (antihypertensive medicines) are available, if needed, to bring blood pressure within a normal range. Communication  Review all the medicines you take with your caregiver because there may be side effects or interactions.  Talk with your caregiver about your diet, exercise habits, and other lifestyle factors that may be contributing to high blood pressure.  See your caregiver regularly. Your caregiver can help you create and adjust your plan for managing high blood pressure. RECOMMENDATIONS FOR TREATMENT AND FOLLOW-UP  The following recommendations are based on current guidelines for managing high blood pressure in nonpregnant adults. Use these recommendations to identify the proper follow-up period or treatment option based on your blood pressure reading. You can discuss these options with your caregiver.  Systolic pressure of 120 to 139 or diastolic pressure of 80 to 89: Follow up with your caregiver as directed.  Systolic pressure of 140 to 160 or diastolic pressure of 90 to 100: Follow up with your caregiver within 2 months.  Systolic pressure above 160 or diastolic  pressure above 100: Follow up with your caregiver within 1 month.  Systolic pressure above 180 or diastolic pressure above 110: Consider antihypertensive therapy; follow up with your caregiver within 1 week.  Systolic pressure above 200 or diastolic pressure above 120: Begin antihypertensive therapy; follow up with your caregiver within 1 week. Document Released: 11/28/2011 Document Reviewed: 11/28/2011 [facility] Patient Information 2015 [place], [place]. This information is not intended to replace advice given to you by your health care provider. Make sure you discuss any questions you have with your health care provider. How to Take Your Blood Pressure HOW DO I GET A BLOOD PRESSURE MACHINE?  You can buy an electronic home blood pressure machine at your local pharmacy. Insurance will sometimes cover the cost if you have a prescription.  Ask your doctor what type of machine is best for you. There are different machines for your arm and your wrist.  If you decide to buy a machine to check your blood pressure on your arm, first check the size of your arm so you can buy the right size cuff. To check the size of your arm:   Use a measuring tape that shows both inches and centimeters.   Wrap the measuring tape around the upper-middle part of your arm. You may need someone to help you measure.   Write down your arm measurement in both inches and centimeters.   To measure your blood pressure correctly, it is important to have the right size cuff.   If your  arm is up to 13 inches (up to 34 centimeters), get an adult cuff size.  If your arm is 13 to 17 inches (35 to 44 centimeters), get a large adult cuff size.    If your arm is 17 to 20 inches (45 to 52 centimeters), get an adult thigh cuff.  WHAT DO THE NUMBERS MEAN?   There are two numbers that make up your blood pressure. For example: 120/80.  The first number (120 in our example) is called the "systolic pressure." It is a measure of  the pressure in your blood vessels when your heart is pumping blood.  The second number (80 in our example) is called the "diastolic pressure." It is a measure of the pressure in your blood vessels when your heart is resting between beats.  Your doctor will tell you what your blood pressure should be. WHAT SHOULD I DO BEFORE I CHECK MY BLOOD PRESSURE?   Try to rest or relax for at least 30 minutes before you check your blood pressure.  Do not smoke.  Do not have any drinks with caffeine, such as:  Soda.  Coffee.  Tea.  Check your blood pressure in a quiet room.  Sit down and stretch out your arm on a table. Keep your arm at about the level of your heart. Let your arm relax.  Make sure that your legs are not crossed. HOW DO I CHECK MY BLOOD PRESSURE?  Follow the directions that came with your machine.  Make sure you remove any tight-fighting clothing from your arm or wrist. Wrap the cuff around your upper arm or wrist. You should be able to fit a finger between the cuff and your arm. If you cannot fit a finger between the cuff and your arm, it is too tight and should be removed and rewrapped.  Some units require you to manually pump up the arm cuff.  Automatic units inflate the cuff when you press a button.  Cuff deflation is automatic in both models.  After the cuff is inflated, the unit measures your blood pressure and pulse. The readings are shown on a monitor. Hold still and breathe normally while the cuff is inflated.  Getting a reading takes less than a minute.  Some models store readings in a memory. Some provide a printout of readings. If your machine does not store your readings, keep a written record.  Take readings with you to your next visit with your doctor. Document Released: 02/16/2008 Document Revised: 07/20/2013 Document Reviewed: 04/30/2013 [facility] Patient Information 2015 [place], [place]. This information is not intended to replace advice given to you by  your health care provider. Make sure you discuss any questions you have with your health care provider.

## 2014-12-07 NOTE — Progress Notes (Addendum)
Subjective:  This chart was scribed for Norberto Sorenson, MD by Broadus John, Medical Scribe. This patient was seen in Room 1 and the patient's care was started at 10:28 AM.   Patient ID: Kristine Johns, female    DOB: June 11, 1965, 49 y.o.   MRN: [id number]  Chief Complaint  Patient presents with  . blood pressure check    discuss blood pressure yesterday, 154/100    HPI HPI Comments: Kristine Johns is a 49 y.o. female who presents to [facility] complaining of elevated blood pressure. Pt reports that her normal range usually runs 120/80.  Pt reports that she had an office visit here about a month ago, and had an elevated blood pressure, she does not recall the range; she indicates that could possibly due to going through stressful events at that time. However, she reports that yesterday her blood pressure was running 154/100. She states that she has never been on blood pressure medications previously. She notes that she has recently bought a blood pressure cuff to record her bp. Pt indicates that she is now watching her diet and doing more exercising to improve her condition, she is aware that having an improvement in her lifestyle is a major step that she could take to prevent the symptoms. However, pt indicates that she needs to be more hydrated due to her being dependant on soda drinks, she does states that she is working on changing that.    Patient Active Problem List   Diagnosis Date Noted  . Menorrhagia    Past Medical History  Diagnosis Date  . Menorrhagia    History reviewed. No pertinent past surgical history. No Known Allergies Prior to Admission medications   Medication Sig Start Date End Date Taking? Authorizing Provider  levonorgestrel (MIRENA) 20 MCG/24HR IUD 1 each by Intrauterine route once. 02/15/08 02/14/13  Historical Provider, MD  Multiple Vitamin (MULTIVITAMIN) tablet Take 1 tablet by mouth daily.    Historical Provider, MD   Social History    Social History  . Marital Status: Married    Spouse Name: N/A  . Number of Children: N/A  . Years of Education: N/A   Occupational History  . Not on file.   Social History Main Topics  . Smoking status: Former Games developer  . Smokeless tobacco: Never Used  . Alcohol Use: Yes     Comment: occasional wine  . Drug Use: No  . Sexual Activity: Yes    Birth Control/ Protection: IUD     Comment: Mirena   Other Topics Concern  . Not on file   Social History Narrative    Review of Systems  Constitutional: Positive for fatigue. Negative for fever, chills, diaphoresis and appetite change.  Eyes: Negative for visual disturbance.  Respiratory: Negative for cough and shortness of breath.   Cardiovascular: Negative for chest pain, palpitations and leg swelling.  Genitourinary: Negative for decreased urine volume.  Neurological: Negative for syncope and headaches.  Hematological: Does not bruise/bleed easily.       Objective:   Physical Exam  Constitutional: She is oriented to person, place, and time. She appears well-developed and well-nourished. No distress.  HENT:  Head: Normocephalic and atraumatic.  Eyes: EOM are normal. Pupils are equal, round, and reactive to light.  Neck: Neck supple.  Cardiovascular: Normal rate.   Pulmonary/Chest: Effort normal.  Neurological: She is alert and oriented to person, place, and time. No cranial nerve deficit.  Skin: Skin is warm and dry.  Psychiatric: She has a normal mood and affect. Her behavior is normal.  Nursing note and vitals reviewed.  BP 108/72 mmHg  Pulse 66  Temp(Src) 98.6 F (37 C) (Oral)  Resp 16  Ht  (1.727 m)  Wt 256 lb 12.8 oz (116.484 kg)  BMI 39.06 kg/m2  SpO2 95%  Blood pressure: 10:37 AM Right arm/resting-160/100 , Left arm/resting-150/90  (personal blood pressure cuff)                               10:39 AM Right arm/resting-140/86   (clinic's blood pressure cuff)    Repeat BP 138/90 L arm clinic  cuff 130/90 Rt arm clinic cuff Assessment & Plan:   1. Elevated blood pressure   Pt has been working very hard at diet and exercise and doing amazing.  Cont to monitor bp outside office and start low dose hctz if needed but pt has made marked improvement with tlc along and will hopefully cont to do so thereby avoid meds. rec f/u in 3 mos for CPE with fasting labs. Home bp cuff does not appear to be at all accurate - return and try another. Cont to monitor at least 3x/wk  Orders Placed This Encounter  Procedures  . Comprehensive metabolic panel  . Microalbumin/Creatinine Ratio, Urine  . POCT glycosylated hemoglobin (Hb A1C)    Meds ordered this encounter  Medications  . hydrochlorothiazide (HYDRODIURIL) 12.5 MG tablet    Sig: Take 1 tablet (12.5 mg total) by mouth daily.    Dispense:  90 tablet    Refill:  1    I personally performed the services described in this documentation, which was scribed in my presence. The recorded information has been reviewed and considered, and addended by me as needed.  Norberto Sorenson, MD MPH    By signing my name below, I, Rawaa Al Rifaie, attest that this documentation has been prepared under the direction and in the presence of Norberto Sorenson, MD.  Broadus John, Medical Scribe. 12/07/2014.  10:49 AM.

## 2014-12-08 ENCOUNTER — Encounter: Payer: Self-pay | Admitting: Family Medicine

## 2014-12-08 LAB — MICROALBUMIN / CREATININE URINE RATIO
Creatinine, Urine: 219.6 mg/dL
Microalb Creat Ratio: 3.2 mg/g (ref 0.0–30.0)
Microalb, Ur: 0.7 mg/dL (ref <2.0)

## 2014-12-10 DIAGNOSIS — R87610 Atypical squamous cells of undetermined significance on cytologic smear of cervix (ASC-US): Secondary | ICD-10-CM | POA: Insufficient documentation

## 2015-03-03 ENCOUNTER — Ambulatory Visit
Admission: RE | Admit: 2015-03-03 | Discharge: 2015-03-03 | Disposition: A | Discharge status: Home / Self Care | Payer: Self-pay | Source: Ambulatory Visit | Attending: Obstetrics and Gynecology | Admitting: Obstetrics and Gynecology

## 2015-03-03 DIAGNOSIS — N63 Unspecified lump in unspecified breast: Secondary | ICD-10-CM

## 2015-12-16 DIAGNOSIS — A6000 Herpesviral infection of urogenital system, unspecified: Secondary | ICD-10-CM | POA: Insufficient documentation

## 2016-10-03 DIAGNOSIS — R195 Other fecal abnormalities: Secondary | ICD-10-CM | POA: Diagnosis not present

## 2016-10-03 DIAGNOSIS — R350 Frequency of micturition: Secondary | ICD-10-CM | POA: Diagnosis not present

## 2016-10-03 DIAGNOSIS — R1013 Epigastric pain: Secondary | ICD-10-CM | POA: Diagnosis not present

## 2016-10-12 DIAGNOSIS — R194 Change in bowel habit: Secondary | ICD-10-CM | POA: Diagnosis not present

## 2016-10-12 DIAGNOSIS — R350 Frequency of micturition: Secondary | ICD-10-CM | POA: Diagnosis not present

## 2016-10-12 DIAGNOSIS — R5383 Other fatigue: Secondary | ICD-10-CM | POA: Diagnosis not present

## 2017-03-22 DIAGNOSIS — H524 Presbyopia: Secondary | ICD-10-CM | POA: Diagnosis not present

## 2017-03-25 DIAGNOSIS — Z124 Encounter for screening for malignant neoplasm of cervix: Secondary | ICD-10-CM | POA: Diagnosis not present

## 2017-03-25 DIAGNOSIS — E559 Vitamin D deficiency, unspecified: Secondary | ICD-10-CM | POA: Insufficient documentation

## 2017-03-25 DIAGNOSIS — Z01419 Encounter for gynecological examination (general) (routine) without abnormal findings: Secondary | ICD-10-CM | POA: Diagnosis not present

## 2017-03-25 DIAGNOSIS — N941 Unspecified dyspareunia: Secondary | ICD-10-CM | POA: Diagnosis not present

## 2017-03-25 DIAGNOSIS — Z3040 Encounter for surveillance of contraceptives, unspecified: Secondary | ICD-10-CM | POA: Diagnosis not present

## 2017-03-25 DIAGNOSIS — Z1231 Encounter for screening mammogram for malignant neoplasm of breast: Secondary | ICD-10-CM | POA: Diagnosis not present

## 2017-09-09 DIAGNOSIS — E6609 Other obesity due to excess calories: Secondary | ICD-10-CM | POA: Diagnosis not present

## 2017-09-09 DIAGNOSIS — Z1159 Encounter for screening for other viral diseases: Secondary | ICD-10-CM | POA: Diagnosis not present

## 2017-09-09 DIAGNOSIS — E785 Hyperlipidemia, unspecified: Secondary | ICD-10-CM | POA: Diagnosis not present

## 2017-09-09 DIAGNOSIS — Z0000 Encounter for general adult medical examination without abnormal findings: Secondary | ICD-10-CM | POA: Diagnosis not present

## 2017-10-16 DIAGNOSIS — M62830 Muscle spasm of back: Secondary | ICD-10-CM | POA: Diagnosis not present

## 2017-10-16 DIAGNOSIS — R109 Unspecified abdominal pain: Secondary | ICD-10-CM | POA: Diagnosis not present

## 2021-04-13 DIAGNOSIS — Z1231 Encounter for screening mammogram for malignant neoplasm of breast: Secondary | ICD-10-CM | POA: Diagnosis not present

## 2021-04-13 DIAGNOSIS — R69 Illness, unspecified: Secondary | ICD-10-CM | POA: Diagnosis not present

## 2021-04-13 DIAGNOSIS — E7800 Pure hypercholesterolemia, unspecified: Secondary | ICD-10-CM | POA: Insufficient documentation

## 2021-05-25 LAB — COLOGUARD

## 2021-06-18 LAB — COLOGUARD: COLOGUARD: NEGATIVE

## 2021-09-18 ENCOUNTER — Encounter (HOSPITAL_COMMUNITY): Payer: Self-pay | Admitting: Emergency Medicine

## 2021-09-18 ENCOUNTER — Ambulatory Visit (HOSPITAL_COMMUNITY)
Admission: EM | Admit: 2021-09-18 | Discharge: 2021-09-18 | Disposition: A | Discharge status: Home / Self Care | Payer: Self-pay | Attending: Physician Assistant | Admitting: Physician Assistant

## 2021-09-18 DIAGNOSIS — S161XXA Strain of muscle, fascia and tendon at neck level, initial encounter: Secondary | ICD-10-CM

## 2021-09-18 MED ORDER — CYCLOBENZAPRINE HCL 5 MG PO TABS: 5.0000 mg | ORAL_TABLET | Freq: Three times a day (TID) | ORAL | 0 refills | Status: AC | PRN

## 2021-09-18 NOTE — Discharge Instructions (Signed)
Take muscle relaxer as needed every 8 hours for muscle spasm Can apply Voltaren gel which is over the counter Apply ice and or heat to affected area Can take Tylenol as needed for pain  Recommend gentle range of motion stretching over the next few days.  Return if symptoms change or follow up with primary care physician
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
## 2022-11-16 DIAGNOSIS — Z6832 Body mass index (BMI) 32.0-32.9, adult: Secondary | ICD-10-CM | POA: Diagnosis not present

## 2022-11-16 DIAGNOSIS — L209 Atopic dermatitis, unspecified: Secondary | ICD-10-CM | POA: Diagnosis not present

## 2022-11-16 DIAGNOSIS — B379 Candidiasis, unspecified: Secondary | ICD-10-CM | POA: Diagnosis not present

## 2022-11-16 DIAGNOSIS — T3695XA Adverse effect of unspecified systemic antibiotic, initial encounter: Secondary | ICD-10-CM | POA: Diagnosis not present

## 2022-12-03 DIAGNOSIS — Z139 Encounter for screening, unspecified: Secondary | ICD-10-CM | POA: Diagnosis not present

## 2022-12-03 DIAGNOSIS — Z01419 Encounter for gynecological examination (general) (routine) without abnormal findings: Secondary | ICD-10-CM | POA: Diagnosis not present

## 2022-12-03 DIAGNOSIS — Z1231 Encounter for screening mammogram for malignant neoplasm of breast: Secondary | ICD-10-CM | POA: Diagnosis not present

## 2022-12-03 DIAGNOSIS — Z1211 Encounter for screening for malignant neoplasm of colon: Secondary | ICD-10-CM | POA: Diagnosis not present

## 2022-12-03 DIAGNOSIS — Z1331 Encounter for screening for depression: Secondary | ICD-10-CM | POA: Diagnosis not present

## 2022-12-03 DIAGNOSIS — Z634 Disappearance and death of family member: Secondary | ICD-10-CM | POA: Diagnosis not present

## 2022-12-03 DIAGNOSIS — Z124 Encounter for screening for malignant neoplasm of cervix: Secondary | ICD-10-CM | POA: Diagnosis not present

## 2022-12-04 DIAGNOSIS — R921 Mammographic calcification found on diagnostic imaging of breast: Secondary | ICD-10-CM | POA: Insufficient documentation

## 2022-12-05 ENCOUNTER — Other Ambulatory Visit: Payer: Self-pay | Admitting: Obstetrics and Gynecology

## 2022-12-05 DIAGNOSIS — R928 Other abnormal and inconclusive findings on diagnostic imaging of breast: Secondary | ICD-10-CM

## 2022-12-06 ENCOUNTER — Ambulatory Visit
Admission: RE | Admit: 2022-12-06 | Discharge: 2022-12-06 | Disposition: A | Discharge status: Home / Self Care | Payer: 59 | Source: Ambulatory Visit | Attending: Obstetrics and Gynecology | Admitting: Obstetrics and Gynecology

## 2022-12-06 DIAGNOSIS — R921 Mammographic calcification found on diagnostic imaging of breast: Secondary | ICD-10-CM | POA: Diagnosis not present

## 2022-12-06 DIAGNOSIS — R928 Other abnormal and inconclusive findings on diagnostic imaging of breast: Secondary | ICD-10-CM

## 2022-12-10 ENCOUNTER — Other Ambulatory Visit: Payer: Self-pay | Admitting: Obstetrics and Gynecology

## 2022-12-10 DIAGNOSIS — R921 Mammographic calcification found on diagnostic imaging of breast: Secondary | ICD-10-CM

## 2024-01-11 ENCOUNTER — Ambulatory Visit
Admission: RE | Admit: 2024-01-11 | Discharge: 2024-01-11 | Disposition: A | Discharge status: Home / Self Care | Source: Ambulatory Visit | Attending: Obstetrics and Gynecology | Admitting: Obstetrics and Gynecology

## 2024-01-11 ENCOUNTER — Other Ambulatory Visit: Payer: Self-pay | Admitting: Obstetrics and Gynecology

## 2024-01-11 DIAGNOSIS — Z1231 Encounter for screening mammogram for malignant neoplasm of breast: Secondary | ICD-10-CM

## 2024-01-27 ENCOUNTER — Ambulatory Visit (INDEPENDENT_AMBULATORY_CARE_PROVIDER_SITE_OTHER): Payer: Self-pay | Admitting: Nurse Practitioner

## 2024-01-27 ENCOUNTER — Encounter: Payer: Self-pay | Admitting: Nurse Practitioner

## 2024-01-27 VITALS — BP 147/86 | Resp 16 | Ht 67.0 in | Wt 208.0 lb

## 2024-01-27 DIAGNOSIS — Z0289 Encounter for other administrative examinations: Secondary | ICD-10-CM

## 2024-01-27 DIAGNOSIS — I10 Essential (primary) hypertension: Secondary | ICD-10-CM | POA: Insufficient documentation

## 2024-01-27 DIAGNOSIS — E669 Obesity, unspecified: Secondary | ICD-10-CM | POA: Insufficient documentation

## 2024-01-27 NOTE — Progress Notes (Signed)
 The patient presented for a primary care visit on 01/27/2024 to establish care. Blood pressure screening was conducted, and the result was 147/86. During the appointment, the patient declined the SDOH screener at this visit and chart was updated to reflect this. The pt shared that she does not have insurance. CHW asked pt if she wishes to obtain health insurance resources. Pt agreed to obtain health insurance resources just in case needed. CHW provided pt with [employer] and Medicaid resources.  A review of the patient's chart revealed that they currently have a primary care provider (Dr. Lannie Purdue) and no future appointments were indicated.  An additional follow up will be done in according to the health equity team's protocol.

## 2024-01-27 NOTE — Progress Notes (Signed)
 Substitute physical form completion   Location: Patient: in office  Provider: In office     Patient: Kristine Johns   DOB: [birth year]   58 y.o. Female  MRN: [id number]  Subjective:    Chief Complaint  Patient presents with   Establish Care    Kristine Johns is a 58 y.o. female who presents today for assistance in completing a form for [employer] system.   Remote history of HTN not currently receiving primary care    Most recent fall risk assessment:    01/27/2024    1:52 PM  Fall Risk   Falls in the past year? 1  Number falls in past yr: 0  Injury with Fall? 0  Risk for fall due to : No Fall Risks  Follow up Falls evaluation completed     Most recent depression screenings:    01/27/2024    2:03 PM 12/07/2014   10:23 AM  PHQ 2/9 Scores  PHQ - 2 Score 0 0     Patient Care Team: Alec House, MD as PCP - General (Family Medicine)      Objective:     BP (!) 147/86 (BP Location: Left Arm, Patient Position: Sitting, Cuff Size: Large)   Resp 16   Ht 5' 7 (1.702 m)   Wt 208 lb (94.3 kg)   SpO2 97%   BMI 32.58 kg/m    Physical Exam Constitutional:      General: She is not in acute distress.    Appearance: Normal appearance. She is not ill-appearing.  HENT:     Nose: Nose normal.     Mouth/Throat:     Mouth: Mucous membranes are moist.  Cardiovascular:     Rate and Rhythm: Normal rate and regular rhythm.  Pulmonary:     Effort: Pulmonary effort is normal.  Neurological:     Mental Status: She is alert and oriented to person, place, and time.  Psychiatric:        Mood and Affect: Mood normal.          Assessment & Plan:    1. Encounter for completion of form with patient (Primary)    Completed form for patient, she also met with CHW regarding SDOH needs   Will plan to return office when she has insurance for physical     There is no immunization history on file for this patient.  Health Maintenance   Topic Date Due   HIV Screening  Never done   Hepatitis C Screening  Never done   DTaP/Tdap/Td (1 - Tdap) Never done   Hepatitis B Vaccines 19-59 Average Risk (1 of 3 - 19+ 3-dose series) Never done   Colonoscopy  Never done   Cervical Cancer Screening (HPV/Pap Cotest)  04/14/2017   Pneumococcal Vaccine: 50+ Years (1 of 1 - PCV) Never done   Zoster Vaccines- Shingrix (1 of 2) Never done   Influenza Vaccine  Never done   COVID-19 Vaccine (1 - 2025-26 season) Never done   Mammogram  12/05/2024   HPV VACCINES  Aged Out   Meningococcal B Vaccine  Aged Out    Discussed health benefits of physical activity, and encouraged her to engage in regular exercise appropriate for her age and condition.  Lauraine Kitty, FNP  **Disclaimer: This note may have been dictated with voice recognition software. Similar sounding words can inadvertently be transcribed and this note may contain transcription errors which may not have been corrected upon publication of note.**

## 2024-03-24 NOTE — Progress Notes (Signed)
 Pt was seen at [facility] on 01/27/2024 to establish care. Her BP was 147/86 at the time of the office visit. Pt declined SDOH screening, and stated that she does not have health insurance. [employer]CA and medicaid resources were provided to pt at the appointment.  Chart review indicates that pt has Medicaid, CHW is unable to run real time eligibility at this time.   CHW called pt to verify insurance need. Attempt #1 on 03/24/24 at 1348, pt stated she does not have insurance at this time. However, pt did apply for Medicaid and is waiting to see if she is approved. Pt stated that she will call this week and check on her status. Pt declined additional resources at this time but stated she would call this CHW back if she is not approved. CHW asked pt would she be okay with a follow up call and pt was agreeable.   No letter sent per pt request. Future follow up to be scheduled per [facility] protocol.
